# Patient Record
Sex: Male | Born: 1965 | Race: White | Hispanic: No | Marital: Married | Smoking: Never smoker
Health system: Southern US, Community
[De-identification: ages and names within clinical notes are randomized; demographics above are authoritative.]

## PROBLEM LIST (undated history)

## (undated) DIAGNOSIS — K219 Gastro-esophageal reflux disease without esophagitis: Secondary | ICD-10-CM

## (undated) DIAGNOSIS — I10 Essential (primary) hypertension: Secondary | ICD-10-CM

## (undated) HISTORY — PX: CYSTOSCOPY KIDNEY W/ URETERAL GUIDE WIRE: SUR371

## (undated) HISTORY — PX: LITHOTRIPSY: SUR834

## (undated) HISTORY — PX: KNEE ARTHROSCOPY W/ MENISCAL REPAIR: SHX1877

## (undated) HISTORY — DX: Gastro-esophageal reflux disease without esophagitis: K21.9

## (undated) HISTORY — DX: Essential (primary) hypertension: I10

## (undated) HISTORY — PX: WISDOM TOOTH EXTRACTION: SHX21

---

## 2000-07-19 ENCOUNTER — Ambulatory Visit (HOSPITAL_BASED_OUTPATIENT_CLINIC_OR_DEPARTMENT_OTHER): Admission: RE | Admit: 2000-07-19 | Discharge: 2000-07-19 | Payer: Self-pay | Admitting: Orthopedic Surgery

## 2007-12-01 ENCOUNTER — Encounter: Admission: RE | Admit: 2007-12-01 | Discharge: 2007-12-01 | Payer: Self-pay | Admitting: Orthopedic Surgery

## 2010-12-17 NOTE — Op Note (Signed)
[place]  Patient:    Robert Perkins, Robert Perkins                       MRN: [id number] Proc. Date: 07/19/00 Adm. Date:  [id number] Attending:  Georgena Spurling                           Operative Report  PREOPERATIVE DIAGNOSIS:  Right knee medial meniscus tear and possible plica.  POSTOPERATIVE DIAGNOSIS:  Right knee medial meniscus tear and possible plica.  OPERATION PERFORMED:  Right knee arthroscopy with partial medial meniscectomy and plica debridement.  SURGEON:  Georgena Spurling, M.D.  ASSISTANT:  None.  ANESTHESIA:  MAC.  INDICATIONS FOR PROCEDURE:  The patient is a 45 year old white male with history and physical exam findings as well as MRI findings of medial meniscus tear and possible synovitic symptomatic medial plica.  After informed consent was obtained, he was taken to the operating room.  DESCRIPTION OF PROCEDURE:  The patient was prepped and draped in the usual sterile fashion.  The inferolateral and inferomedial portals were created with a #11 blade, blunt trocar and cannula.  The camera was inserted in the inferolateral portal and a diagnostic arthroscopy was performed.  There was a large synovitic hypertrophy, medial patellofemoral plica.  There was a very large, very complex posterior horn medial meniscus tear.  The lateral compartment was normal.  There was very little chondromalacia in the patellofemoral lateral compartment.  There was grade 2 in the medial compartment.  With the straight basket forceps in the inferomedial compartment, a partial medial meniscectomy was performed.  The great white shaver was used to remove the debris.  This was a very complex tear and did require aggressive meniscectomy.  The great white shaver was then used to debride the synovitic hypertrophy, medial patellofemoral plica.  We then lavaged the knee joint.  We did one further diagnostic arthroscopy to ensure there were no loose bodies left behind and  then evacuated the joint of fluid and instrumentation.  We infiltrated both portals with 10 cc of 0.5% Marcaine morphine mixture and then closed them with Steri-Strips.  We then dressed the wound with 4 x 4s, ABDs, sterile Webril and Ace wrap.  TOURNIQUET TIME:  None.  ESTIMATED BLOOD LOSS:  Minimal.  COMPLICATIONS:  None.  DRAINS:  None. DD:  07/19/00 TD:  07/20/00 Job: [id number]

## 2013-08-15 ENCOUNTER — Encounter (INDEPENDENT_AMBULATORY_CARE_PROVIDER_SITE_OTHER): Payer: Self-pay | Admitting: Surgery

## 2013-08-23 ENCOUNTER — Ambulatory Visit (INDEPENDENT_AMBULATORY_CARE_PROVIDER_SITE_OTHER): Payer: Managed Care, Other (non HMO) | Admitting: Surgery

## 2013-08-23 ENCOUNTER — Encounter (INDEPENDENT_AMBULATORY_CARE_PROVIDER_SITE_OTHER): Payer: Self-pay | Admitting: Surgery

## 2013-08-23 VITALS — BP 112/82 | HR 80 | Temp 98.0°F | Resp 18 | Ht 71.0 in | Wt 224.0 lb

## 2013-08-23 DIAGNOSIS — K429 Umbilical hernia without obstruction or gangrene: Secondary | ICD-10-CM | POA: Insufficient documentation

## 2013-08-23 NOTE — Progress Notes (Signed)
Patient ID: Robert GemmaRandy D Perkins, male   DOB: 03/02/1966, 48 y.o.   MRN: [id number]  Chief Complaint  Patient presents with  . New Evaluation    UMB. Hernia    HPI Robert Perkins is a 48 y.o. male.  Referred by Dr. Tally Joeavid Swayne for evaluation of umbilical hernia HPI This is a healthy 48 year old male who presents with a one-month history of a palpable bulge at the upper edge of his umbilicus. This causes some mild discomfort. The patient's job requires a lot of heavy lifting.  He denies any obstructive symptoms. He was examined and was felt to have an umbilical hernia. Now referred for surgical evaluation. Past Medical History  Diagnosis Date  . GERD (gastroesophageal reflux disease)   . Hypertension     Past Surgical History  Procedure Laterality Date  . Knee arthroscopy w/ meniscal repair  2005/2008    bilaterally    Family History  Problem Relation Age of Onset  . Heart disease Father   . Heart disease Mother   . COPD Mother   . Thyroid nodules Mother   . Emphysema Mother     Social History History  Substance Use Topics  . Smoking status: Never Smoker   . Smokeless tobacco: Not on file  . Alcohol Use: No    No Known Allergies  Current Outpatient Prescriptions  Medication Sig Dispense Refill  . lisinopril (PRINIVIL,ZESTRIL) 20 MG tablet Take 20 mg by mouth daily.      . ranitidine (ZANTAC) 300 MG tablet Take 300 mg by mouth at bedtime.       No current facility-administered medications for this visit.    Review of Systems Review of Systems  Constitutional: Negative for fever, chills and unexpected weight change.  HENT: Negative for congestion, hearing loss, sore throat, trouble swallowing and voice change.   Eyes: Negative for visual disturbance.  Respiratory: Negative for cough and wheezing.   Cardiovascular: Negative for chest pain, palpitations and leg swelling.  Gastrointestinal: Positive for abdominal distention. Negative for nausea, vomiting, abdominal pain,  diarrhea, constipation, blood in stool, anal bleeding and rectal pain.  Genitourinary: Negative for hematuria and difficulty urinating.  Musculoskeletal: Negative for arthralgias.  Skin: Negative for rash and wound.  Neurological: Negative for seizures, syncope, weakness and headaches.  Hematological: Negative for adenopathy. Does not bruise/bleed easily.  Psychiatric/Behavioral: Negative for confusion.    Blood pressure 112/82, pulse 80, temperature 98 F (36.7 C), resp. rate 18, height 5\' 11"  (1.803 m), weight 224 lb (101.606 kg).  Physical Exam Physical Exam WDWN in NAD HEENT:  EOMI, sclera anicteric Neck:  No masses, no thyromegaly Lungs:  CTA bilaterally; normal respiratory effort CV:  Regular rate and rhythm; no murmurs Abd:  +bowel sounds, soft, palpable mass at upper edge of umbilicus - not completely reducible Ext:  Well-perfused; no edema Skin:  Warm, dry; no sign of jaundice  Data Reviewed none  Assessment    Umbilical hernia - partially reducible     Plan    Umbilical hernia repair with mesh.  The surgical procedure has been discussed with the patient.  Potential risks, benefits, alternative treatments, and expected outcomes have been explained.  All of the patient's questions at this time have been answered.  The likelihood of reaching the patient's treatment goal is good.  The patient understand the proposed surgical procedure and wishes to proceed.         Jaxsen Bernhart K. 08/23/2013, 10:43 AM

## 2013-09-02 ENCOUNTER — Encounter (HOSPITAL_BASED_OUTPATIENT_CLINIC_OR_DEPARTMENT_OTHER): Payer: Self-pay | Admitting: *Deleted

## 2013-09-02 NOTE — Progress Notes (Signed)
Will come in for [id number] 

## 2013-09-04 ENCOUNTER — Other Ambulatory Visit: Payer: Self-pay

## 2013-09-04 ENCOUNTER — Encounter (HOSPITAL_BASED_OUTPATIENT_CLINIC_OR_DEPARTMENT_OTHER)
Admission: RE | Admit: 2013-09-04 | Discharge: 2013-09-04 | Disposition: A | Discharge status: Home / Self Care | Payer: Managed Care, Other (non HMO) | Source: Ambulatory Visit | Attending: Surgery | Admitting: Surgery

## 2013-09-04 LAB — BASIC METABOLIC PANEL
BUN: 14 mg/dL (ref 6–23)
CALCIUM: 9.3 mg/dL (ref 8.4–10.5)
CO2: 27 meq/L (ref 19–32)
CREATININE: 1.09 mg/dL (ref 0.50–1.35)
Chloride: 101 mEq/L (ref 96–112)
GFR calc Af Amer: >90 mL/min (ref >90)
GFR, EST NON AFRICAN AMERICAN: 78 mL/min — AB (ref >90)
GLUCOSE: 82 mg/dL (ref 70–99)
Potassium: 4.6 mEq/L (ref 3.7–5.3)
Sodium: 140 mEq/L (ref 137–147)

## 2013-09-05 ENCOUNTER — Encounter (INDEPENDENT_AMBULATORY_CARE_PROVIDER_SITE_OTHER): Payer: Self-pay

## 2013-09-06 ENCOUNTER — Ambulatory Visit (HOSPITAL_BASED_OUTPATIENT_CLINIC_OR_DEPARTMENT_OTHER)
Admission: RE | Admit: 2013-09-06 | Discharge: 2013-09-06 | Disposition: A | Discharge status: Home / Self Care | Payer: Managed Care, Other (non HMO) | Source: Ambulatory Visit | Attending: Surgery | Admitting: Surgery

## 2013-09-06 ENCOUNTER — Encounter (HOSPITAL_BASED_OUTPATIENT_CLINIC_OR_DEPARTMENT_OTHER): Payer: Managed Care, Other (non HMO) | Admitting: Certified Registered"

## 2013-09-06 ENCOUNTER — Ambulatory Visit (HOSPITAL_BASED_OUTPATIENT_CLINIC_OR_DEPARTMENT_OTHER): Payer: Managed Care, Other (non HMO) | Admitting: Certified Registered"

## 2013-09-06 ENCOUNTER — Encounter (HOSPITAL_BASED_OUTPATIENT_CLINIC_OR_DEPARTMENT_OTHER)
Admission: RE | Disposition: A | Discharge status: Home / Self Care | Payer: Self-pay | Source: Ambulatory Visit | Attending: Surgery

## 2013-09-06 ENCOUNTER — Encounter (HOSPITAL_BASED_OUTPATIENT_CLINIC_OR_DEPARTMENT_OTHER): Payer: Self-pay | Admitting: *Deleted

## 2013-09-06 DIAGNOSIS — K219 Gastro-esophageal reflux disease without esophagitis: Secondary | ICD-10-CM | POA: Insufficient documentation

## 2013-09-06 DIAGNOSIS — I10 Essential (primary) hypertension: Secondary | ICD-10-CM | POA: Insufficient documentation

## 2013-09-06 DIAGNOSIS — K429 Umbilical hernia without obstruction or gangrene: Secondary | ICD-10-CM

## 2013-09-06 HISTORY — PX: UMBILICAL HERNIA REPAIR: SHX196

## 2013-09-06 LAB — POCT HEMOGLOBIN-HEMACUE: HEMOGLOBIN: 17.4 g/dL — AB (ref 13.0–17.0)

## 2013-09-06 SURGERY — REPAIR, HERNIA, UMBILICAL, ADULT
Anesthesia: General | Site: Abdomen

## 2013-09-06 MED ORDER — HYDROCODONE-IBUPROFEN 5-200 MG PO TABS: 1.0000 | ORAL_TABLET | Freq: Three times a day (TID) | ORAL | Status: DC | PRN

## 2013-09-06 MED ORDER — DEXAMETHASONE SODIUM PHOSPHATE 4 MG/ML IJ SOLN
INTRAMUSCULAR | Status: DC | PRN
  Administered 2013-09-06: 10 mg via INTRAVENOUS

## 2013-09-06 MED ORDER — CEFAZOLIN SODIUM-DEXTROSE 2-3 GM-% IV SOLR
2.0000 g | INTRAVENOUS | Status: AC
  Administered 2013-09-06: 2 g via INTRAVENOUS

## 2013-09-06 MED ORDER — BUPIVACAINE-EPINEPHRINE 0.25% -1:200000 IJ SOLN
INTRAMUSCULAR | Status: DC | PRN
  Administered 2013-09-06: 10 mL

## 2013-09-06 MED ORDER — LIDOCAINE HCL (CARDIAC) 20 MG/ML IV SOLN
INTRAVENOUS | Status: DC | PRN
  Administered 2013-09-06: 40 mg via INTRAVENOUS

## 2013-09-06 MED ORDER — OXYCODONE-ACETAMINOPHEN 5-325 MG PO TABS: 1.0000 | ORAL_TABLET | ORAL | Status: DC | PRN

## 2013-09-06 MED ORDER — PROPOFOL 10 MG/ML IV BOLUS
INTRAVENOUS | Status: DC | PRN
  Administered 2013-09-06: 200 mg via INTRAVENOUS

## 2013-09-06 MED ORDER — CHLORHEXIDINE GLUCONATE 4 % EX LIQD: 1.0000 "application " | Freq: Once | CUTANEOUS | Status: DC

## 2013-09-06 MED ORDER — OXYCODONE HCL 5 MG/5ML PO SOLN: 5.0000 mg | Freq: Once | ORAL | Status: AC | PRN

## 2013-09-06 MED ORDER — FENTANYL CITRATE 0.05 MG/ML IJ SOLN
INTRAMUSCULAR | Status: AC
  Filled 2013-09-06: qty 6

## 2013-09-06 MED ORDER — OXYCODONE HCL 5 MG PO TABS
5.0000 mg | ORAL_TABLET | Freq: Once | ORAL | Status: AC | PRN
  Administered 2013-09-06: 5 mg via ORAL
  Filled 2013-09-06: qty 1

## 2013-09-06 MED ORDER — HYDROMORPHONE HCL PF 1 MG/ML IJ SOLN: 0.2500 mg | INTRAMUSCULAR | Status: DC | PRN

## 2013-09-06 MED ORDER — PROMETHAZINE HCL 25 MG/ML IJ SOLN: 6.2500 mg | INTRAMUSCULAR | Status: DC | PRN

## 2013-09-06 MED ORDER — CEFAZOLIN SODIUM-DEXTROSE 2-3 GM-% IV SOLR
INTRAVENOUS | Status: AC
  Filled 2013-09-06: qty 50

## 2013-09-06 MED ORDER — MIDAZOLAM HCL 2 MG/2ML IJ SOLN
INTRAMUSCULAR | Status: AC
  Filled 2013-09-06: qty 2

## 2013-09-06 MED ORDER — LACTATED RINGERS IV SOLN
INTRAVENOUS | Status: DC
Start: 2013-09-06 — End: 2013-09-06
  Administered 2013-09-06: 14:00:00 via INTRAVENOUS

## 2013-09-06 MED ORDER — FENTANYL CITRATE 0.05 MG/ML IJ SOLN
INTRAMUSCULAR | Status: DC | PRN
  Administered 2013-09-06: 25 ug via INTRAVENOUS
  Administered 2013-09-06: 100 ug via INTRAVENOUS

## 2013-09-06 MED ORDER — BUPIVACAINE-EPINEPHRINE PF 0.25-1:200000 % IJ SOLN
INTRAMUSCULAR | Status: AC
  Filled 2013-09-06: qty 30

## 2013-09-06 MED ORDER — MORPHINE SULFATE 2 MG/ML IJ SOLN: 2.0000 mg | INTRAMUSCULAR | Status: DC | PRN

## 2013-09-06 MED ORDER — FENTANYL CITRATE 0.05 MG/ML IJ SOLN: 50.0000 ug | INTRAMUSCULAR | Status: DC | PRN

## 2013-09-06 MED ORDER — BUPIVACAINE-EPINEPHRINE PF 0.25-1:200000 % IJ SOLN
INTRAMUSCULAR | Status: AC
Start: 2013-09-06 — End: 2013-09-06
  Filled 2013-09-06: qty 30

## 2013-09-06 MED ORDER — MIDAZOLAM HCL 2 MG/2ML IJ SOLN: 1.0000 mg | INTRAMUSCULAR | Status: DC | PRN

## 2013-09-06 MED ORDER — SUCCINYLCHOLINE CHLORIDE 20 MG/ML IJ SOLN
INTRAMUSCULAR | Status: DC | PRN
  Administered 2013-09-06: 100 mg via INTRAVENOUS

## 2013-09-06 MED ORDER — KETOROLAC TROMETHAMINE 30 MG/ML IJ SOLN
INTRAMUSCULAR | Status: DC | PRN
  Administered 2013-09-06: 30 mg via INTRAVENOUS

## 2013-09-06 MED ORDER — ONDANSETRON HCL 4 MG/2ML IJ SOLN
INTRAMUSCULAR | Status: DC | PRN
  Administered 2013-09-06: 4 mg via INTRAVENOUS

## 2013-09-06 MED ORDER — MIDAZOLAM HCL 5 MG/5ML IJ SOLN
INTRAMUSCULAR | Status: DC | PRN
  Administered 2013-09-06: 2 mg via INTRAVENOUS

## 2013-09-06 MED ORDER — ONDANSETRON HCL 4 MG/2ML IJ SOLN: 4.0000 mg | INTRAMUSCULAR | Status: DC | PRN

## 2013-09-06 SURGICAL SUPPLY — 59 items
APL SKNCLS STERI-STRIP NONHPOA (GAUZE/BANDAGES/DRESSINGS) ×2
APPLICATOR COTTON TIP 6IN STRL (MISCELLANEOUS) IMPLANT
BENZOIN TINCTURE PRP APPL 2/3 (GAUZE/BANDAGES/DRESSINGS) ×3 IMPLANT
BLADE HEX COATED 2.75 (ELECTRODE) ×4 IMPLANT
BLADE SURG 15 STRL LF DISP TIS (BLADE) ×2 IMPLANT
BLADE SURG 15 STRL SS (BLADE) ×4
BLADE SURG ROTATE 9660 (MISCELLANEOUS) ×4 IMPLANT
CANISTER SUCT 1200ML W/VALVE (MISCELLANEOUS) IMPLANT
CHLORAPREP W/TINT 26ML (MISCELLANEOUS) ×4 IMPLANT
CLOSURE WOUND 1/2 X4 (GAUZE/BANDAGES/DRESSINGS)
COVER MAYO STAND STRL (DRAPES) ×4 IMPLANT
COVER TABLE BACK 60X90 (DRAPES) ×4 IMPLANT
DECANTER SPIKE VIAL GLASS SM (MISCELLANEOUS) ×1 IMPLANT
DRAPE LAPAROTOMY T 102X78X121 (DRAPES) ×4 IMPLANT
DRAPE UTILITY XL STRL (DRAPES) ×4 IMPLANT
DRSG TEGADERM 4X4.75 (GAUZE/BANDAGES/DRESSINGS) ×4 IMPLANT
ELECT REM PT RETURN 9FT ADLT (ELECTROSURGICAL) ×4
ELECTRODE REM PT RTRN 9FT ADLT (ELECTROSURGICAL) ×2 IMPLANT
GLOVE BIO SURGEON STRL SZ7 (GLOVE) ×4 IMPLANT
GLOVE BIOGEL PI IND STRL 7.0 (GLOVE) ×1 IMPLANT
GLOVE BIOGEL PI IND STRL 7.5 (GLOVE) ×2 IMPLANT
GLOVE BIOGEL PI INDICATOR 7.0 (GLOVE) ×2
GLOVE BIOGEL PI INDICATOR 7.5 (GLOVE) ×2
GLOVE ECLIPSE 6.5 STRL STRAW (GLOVE) ×3 IMPLANT
GOWN STRL REUS W/ TWL LRG LVL3 (GOWN DISPOSABLE) ×4 IMPLANT
GOWN STRL REUS W/TWL LRG LVL3 (GOWN DISPOSABLE) ×8
NDL HYPO 25X1 1.5 SAFETY (NEEDLE) IMPLANT
NDL SAFETY ECLIPSE 18X1.5 (NEEDLE) IMPLANT
NEEDLE HYPO 18GX1.5 SHARP (NEEDLE)
NEEDLE HYPO 25X1 1.5 SAFETY (NEEDLE) IMPLANT
NS IRRIG 1000ML POUR BTL (IV SOLUTION) IMPLANT
PACK BASIN DAY SURGERY FS (CUSTOM PROCEDURE TRAY) ×4 IMPLANT
PENCIL BUTTON HOLSTER BLD 10FT (ELECTRODE) ×4 IMPLANT
SLEEVE SCD COMPRESS KNEE MED (MISCELLANEOUS) ×3 IMPLANT
SPONGE GAUZE 4X4 12PLY STER LF (GAUZE/BANDAGES/DRESSINGS) ×5 IMPLANT
STAPLER VISISTAT 35W (STAPLE) IMPLANT
STRIP CLOSURE SKIN 1/2X4 (GAUZE/BANDAGES/DRESSINGS) IMPLANT
SUT MNCRL AB 4-0 PS2 18 (SUTURE) ×4 IMPLANT
SUT NOVA NAB DX-16 0-1 5-0 T12 (SUTURE) ×1 IMPLANT
SUT NOVA NAB GS-21 0 18 T12 DT (SUTURE) ×3 IMPLANT
SUT PDS AB 0 CT 36 (SUTURE) IMPLANT
SUT PROLENE 0 CT 1 30 (SUTURE) IMPLANT
SUT PROLENE 0 CT 1 CR/8 (SUTURE) IMPLANT
SUT SILK 3 0 TIES 17X18 (SUTURE)
SUT SILK 3-0 18XBRD TIE BLK (SUTURE) IMPLANT
SUT VIC AB 2-0 CT1 27 (SUTURE)
SUT VIC AB 2-0 CT1 TAPERPNT 27 (SUTURE) IMPLANT
SUT VIC AB 3-0 SH 27 (SUTURE) ×8
SUT VIC AB 3-0 SH 27X BRD (SUTURE) ×3 IMPLANT
SUT VIC AB 4-0 BRD 54 (SUTURE) IMPLANT
SUT VIC AB 4-0 SH 18 (SUTURE) IMPLANT
SUT VICRYL 4-0 PS2 18IN ABS (SUTURE) IMPLANT
SYR BULB 3OZ (MISCELLANEOUS) IMPLANT
SYR CONTROL 10ML LL (SYRINGE) IMPLANT
TOWEL OR 17X24 6PK STRL BLUE (TOWEL DISPOSABLE) ×5 IMPLANT
TOWEL OR NON WOVEN STRL DISP B (DISPOSABLE) ×4 IMPLANT
TUBE CONNECTING 20'X1/4 (TUBING)
TUBE CONNECTING 20X1/4 (TUBING) IMPLANT
YANKAUER SUCT BULB TIP NO VENT (SUCTIONS) IMPLANT

## 2013-09-06 NOTE — Discharge Instructions (Signed)
[facility] ° °UMBILICAL OR INGUINAL HERNIA REPAIR: POST OP INSTRUCTIONS ° °Always review your discharge instruction sheet given to you by the facility where your surgery was performed. °IF YOU HAVE DISABILITY OR FAMILY LEAVE FORMS, YOU MUST BRING THEM TO THE OFFICE FOR PROCESSING.   °DO NOT GIVE THEM TO YOUR DOCTOR. ° °1. A  prescription for pain medication may be given to you upon discharge.  Take your pain medication as prescribed, if needed.  If narcotic pain medicine is not needed, then you may take acetaminophen (Tylenol) or ibuprofen (Advil) as needed. °2. Take your usually prescribed medications unless otherwise directed. °3. If you need a refill on your pain medication, please contact your pharmacy.  They will contact our office to request authorization. Prescriptions will not be filled after 5 pm or on week-ends. °4. You should follow a light diet the first 24 hours after arrival home, such as soup and crackers, etc.  Be sure to include lots of fluids daily.  Resume your normal diet the day after surgery. °5. Most patients will experience some swelling and bruising around the umbilicus or in the groin and scrotum.  Ice packs and reclining will help.  Swelling and bruising can take several days to resolve.  °6. It is common to experience some constipation if taking pain medication after surgery.  Increasing fluid intake and taking a stool softener (such as Colace) will usually help or prevent this problem from occurring.  A mild laxative (Milk of Magnesia or Miralax) should be taken according to package directions if there are no bowel movements after 48 hours. °7. Unless discharge instructions indicate otherwise, you may remove your bandages 24-48 hours after surgery, and you may shower at that time.  You will have steri-strips (small skin tapes) in place directly over the incision.  These strips should be left on the skin for 7-10 days. °8. ACTIVITIES:  You may resume regular (light)  daily activities beginning the next day--such as daily self-care, walking, climbing stairs--gradually increasing activities as tolerated.  You may have sexual intercourse when it is comfortable.  Refrain from any heavy lifting or straining until approved by your doctor. °a. You may drive when you are no longer taking prescription pain medication, you can comfortably wear a seatbelt, and you can safely maneuver your car and apply brakes. °b. RETURN TO WORK:  2-3 weeks with light duty - no lifting over 15 lbs. °9. You should see your doctor in the office for a follow-up appointment approximately 2-3 weeks after your surgery.  Make sure that you call for this appointment within a day or two after you arrive home to insure a convenient appointment time. °10. OTHER INSTRUCTIONS:  __________________________________________________________________________________________________________________________________________________________________________________________  °WHEN TO CALL YOUR DOCTOR: °1. Fever over 101.0 °2. Inability to urinate °3. Nausea and/or vomiting °4. Extreme swelling or bruising °5. Continued bleeding from incision. °6. Increased pain, redness, or drainage from the incision ° °The clinic staff is available to answer your questions during regular business hours.  Please don’t hesitate to call and ask to speak to one of the nurses for clinical concerns.  If you have a medical emergency, go to the nearest emergency room or call 911.  A surgeon from [facility] is always on call at the hospital ° ° °[address], [address], [place], [place]  [address] ? ° [address] [address], [place], [place]   [address] °[phone]    [phone]    FAX [phone] °Web   site: [email or web address] ° ° °Post Anesthesia Home Care Instructions ° °Activity: °Get plenty of rest for the remainder of the day. A responsible adult should stay with you for 24 hours following the procedure.  °For the next 24  hours, DO NOT: °-Drive a car °-Operate machinery °-Drink alcoholic beverages °-Take any medication unless instructed by your physician °-Make any legal decisions or sign important papers. ° °Meals: °Start with liquid foods such as gelatin or soup. Progress to regular foods as tolerated. Avoid greasy, spicy, heavy foods. If nausea and/or vomiting occur, drink only clear liquids until the nausea and/or vomiting subsides. Call your physician if vomiting continues. ° °Special Instructions/Symptoms: °Your throat may feel dry or sore from the anesthesia or the breathing tube placed in your throat during surgery. If this causes discomfort, gargle with warm salt water. The discomfort should disappear within 24 hours. ° °

## 2013-09-06 NOTE — Interval H&P Note (Signed)
History and Physical Interval Note:  09/06/2013 11:43 AM  Robert Perkins  has presented today for surgery, with the diagnosis of UMBILICAL HERNIA  The various methods of treatment have been discussed with the patient and family. After consideration of risks, benefits and other options for treatment, the patient has consented to  Procedure(s): HERNIA REPAIR UMBILICAL ADULT (N/A) INSERTION OF MESH (N/A) as a surgical intervention .  The patient's history has been reviewed, patient examined, no change in status, stable for surgery.  I have reviewed the patient's chart and labs.  Questions were answered to the patient's satisfaction.     Dessie Tatem K.

## 2013-09-06 NOTE — Anesthesia Postprocedure Evaluation (Signed)
Anesthesia Post Note  Patient: Robert Perkins  Procedure(s) Performed: Procedure(s) (LRB): HERNIA REPAIR UMBILICAL ADULT (N/A)  Anesthesia type: general  Patient location: PACU  Post pain: Pain level controlled  Post assessment: Patient's Cardiovascular Status Stable  Last Vitals:  Filed Vitals:   09/06/13 1515  BP: 118/83  Pulse: 73  Temp:   Resp: 14    Post vital signs: Reviewed and stable  Level of consciousness: sedated  Complications: No apparent anesthesia complications

## 2013-09-06 NOTE — Op Note (Signed)
Indications:  The patient presented with a history of a small, slowly enlarging umbilical hernia.  The patient was examined and we recommended umbilical hernia repair.  Pre-operative diagnosis:  Umbilical hernia  Post-operative diagnosis:  Same  Procedure:  Umbilical hernia repair  Surgeon: Wynona LunaSUEI,Dhruti Ghuman K.   Assistants: none  Anesthesia: General endotracheal anesthesia  ASA Class: 1   Procedure Details  The patient was seen again in the Holding Room. The risks, benefits, complications, treatment options, and expected outcomes were discussed with the patient. The possibilities of reaction to medication, pulmonary aspiration, perforation of viscus, bleeding, recurrent infection, the need for additional procedures, and development of a complication requiring transfusion or further operation were discussed with the patient and/or family. There was concurrence with the proposed plan, and informed consent was obtained. The site of surgery was properly noted/marked. The patient was taken to the Operating Room, identified as Robert Perkins, and the procedure verified as umbilical hernia repair. A Time Out was held and the above information confirmed.  After an adequate level of general anesthesia was obtained, the patient's abdomen was prepped with Chloraprep and draped in sterile fashion.  We made a transverse incision above the umbilicus.  Dissection was carried down to the hernia sac with cautery.  We dissected bluntly around the hernia sac down to the edge of the fascial defect.  We reduced the hernia sac back into the pre-peritoneal space.  The fascial defect measured 7 mm.  We cleared the fascia in all directions.  The defect was too small to use mesh. The fascial defect was closed with multiple interrupted figure-of-eight 0 Novofil sutures.  The base of the umbilicus was tacked down with 3-0 Vicryl.  3-0 Vicryl was used to close the subcutaneous tissues and 4-0 Monocryl was used to close the  skin.  Steri-strips and clean dressing were applied.  The patient was extubated and brought to the recovery room in stable condition.  All sponge, instrument, and needle counts were correct prior to closure and at the conclusion of the case.   Estimated Blood Loss: Minimal          Complications: None; patient tolerated the procedure well.         Disposition: PACU - hemodynamically stable.         Condition: stable  Wilmon ArmsMatthew K. Corliss Skainssuei, MD, [facility]  General/ Trauma Surgery  09/06/2013 2:45 PM

## 2013-09-06 NOTE — Transfer of Care (Signed)
Immediate Anesthesia Transfer of Care Note  Patient: Robert Perkins  Procedure(s) Performed: Procedure(s): HERNIA REPAIR UMBILICAL ADULT (N/A)  Patient Location: PACU  Anesthesia Type:General  Level of Consciousness: awake and sedated  Airway & Oxygen Therapy: Patient Spontanous Breathing and Patient connected to face mask oxygen  Post-op Assessment: Report given to PACU RN and Post -op Vital signs reviewed and stable  Post vital signs: Reviewed and stable  Complications: No apparent anesthesia complications

## 2013-09-06 NOTE — Anesthesia Procedure Notes (Signed)
Procedure Name: Intubation Performed by: Sarahjane Matherly W Pre-anesthesia Checklist: Patient identified, Timeout performed, Emergency Drugs available, Suction available and Patient being monitored Patient Re-evaluated:Patient Re-evaluated prior to inductionOxygen Delivery Method: Circle system utilized Preoxygenation: Pre-oxygenation with 100% oxygen Intubation Type: IV induction Ventilation: Mask ventilation without difficulty Laryngoscope Size: Miller and 2 Grade View: Grade I Tube type: Oral Number of attempts: 1 Airway Equipment and Method: Stylet Placement Confirmation: ETT inserted through vocal cords under direct vision,  positive ETCO2 and breath sounds checked- equal and bilateral Secured at: 22 cm Tube secured with: Tape Dental Injury: Teeth and Oropharynx as per pre-operative assessment      

## 2013-09-06 NOTE — Anesthesia Preprocedure Evaluation (Signed)
Anesthesia Evaluation  Patient identified by MRN, date of birth, ID band Patient awake    Reviewed: Allergy & Precautions, H&P , NPO status , Patient's Chart, lab work & pertinent test results  Airway Mallampati: II TM Distance: >3 FB Neck ROM: Full    Dental  (+) Teeth Intact and Dental Advisory Given   Pulmonary neg pulmonary ROS,    Pulmonary exam normal       Cardiovascular hypertension, Pt. on medications     Neuro/Psych negative neurological ROS  negative psych ROS   GI/Hepatic Neg liver ROS, GERD-  Medicated,  Endo/Other  negative endocrine ROS  Renal/GU negative Renal ROS  negative genitourinary   Musculoskeletal negative musculoskeletal ROS (+)   Abdominal   Peds negative pediatric ROS (+)  Hematology negative hematology ROS (+)   Anesthesia Other Findings   Reproductive/Obstetrics negative OB ROS                           Anesthesia Physical Anesthesia Plan  ASA: II  Anesthesia Plan: General   Post-op Pain Management:    Induction: Intravenous  Airway Management Planned: LMA and Oral ETT  Additional Equipment:   Intra-op Plan:   Post-operative Plan: Extubation in OR  Informed Consent: I have reviewed the patients History and Physical, chart, labs and discussed the procedure including the risks, benefits and alternatives for the proposed anesthesia with the patient or authorized representative who has indicated his/her understanding and acceptance.   Dental advisory given  Plan Discussed with: CRNA, Anesthesiologist and Surgeon  Anesthesia Plan Comments:         Anesthesia Quick Evaluation

## 2013-09-06 NOTE — H&P (View-Only) (Signed)
Patient ID: Robert Perkins Zent, male   DOB: 1966/01/23, 48 y.o.   MRN: [id number]  Chief Complaint  Patient presents with  . New Evaluation    UMB. Hernia    HPI Robert Perkins Plate is a 48 y.o. male.  Referred by Dr. Tally Joeavid Swayne for evaluation of umbilical hernia HPI This is a healthy 48 year old male who presents with a one-month history of a palpable bulge at the upper edge of his umbilicus. This causes some mild discomfort. The patient's job requires a lot of heavy lifting.  He denies any obstructive symptoms. He was examined and was felt to have an umbilical hernia. Now referred for surgical evaluation. Past Medical History  Diagnosis Date  . GERD (gastroesophageal reflux disease)   . Hypertension     Past Surgical History  Procedure Laterality Date  . Knee arthroscopy w/ meniscal repair  2005/2008    bilaterally    Family History  Problem Relation Age of Onset  . Heart disease Father   . Heart disease Mother   . COPD Mother   . Thyroid nodules Mother   . Emphysema Mother     Social History History  Substance Use Topics  . Smoking status: Never Smoker   . Smokeless tobacco: Not on file  . Alcohol Use: No    No Known Allergies  Current Outpatient Prescriptions  Medication Sig Dispense Refill  . lisinopril (PRINIVIL,ZESTRIL) 20 MG tablet Take 20 mg by mouth daily.      . ranitidine (ZANTAC) 300 MG tablet Take 300 mg by mouth at bedtime.       No current facility-administered medications for this visit.    Review of Systems Review of Systems  Constitutional: Negative for fever, chills and unexpected weight change.  HENT: Negative for congestion, hearing loss, sore throat, trouble swallowing and voice change.   Eyes: Negative for visual disturbance.  Respiratory: Negative for cough and wheezing.   Cardiovascular: Negative for chest pain, palpitations and leg swelling.  Gastrointestinal: Positive for abdominal distention. Negative for nausea, vomiting, abdominal pain,  diarrhea, constipation, blood in stool, anal bleeding and rectal pain.  Genitourinary: Negative for hematuria and difficulty urinating.  Musculoskeletal: Negative for arthralgias.  Skin: Negative for rash and wound.  Neurological: Negative for seizures, syncope, weakness and headaches.  Hematological: Negative for adenopathy. Does not bruise/bleed easily.  Psychiatric/Behavioral: Negative for confusion.    Blood pressure 112/82, pulse 80, temperature 98 F (36.7 C), resp. rate 18, height 5\' 11"  (1.803 m), weight 224 lb (101.606 kg).  Physical Exam Physical Exam WDWN in NAD HEENT:  EOMI, sclera anicteric Neck:  No masses, no thyromegaly Lungs:  CTA bilaterally; normal respiratory effort CV:  Regular rate and rhythm; no murmurs Abd:  +bowel sounds, soft, palpable mass at upper edge of umbilicus - not completely reducible Ext:  Well-perfused; no edema Skin:  Warm, dry; no sign of jaundice  Data Reviewed none  Assessment    Umbilical hernia - partially reducible     Plan    Umbilical hernia repair with mesh.  The surgical procedure has been discussed with the patient.  Potential risks, benefits, alternative treatments, and expected outcomes have been explained.  All of the patient's questions at this time have been answered.  The likelihood of reaching the patient's treatment goal is good.  The patient understand the proposed surgical procedure and wishes to proceed.         Jeyson Deshotel K. 08/23/2013, 10:43 AM

## 2013-09-09 ENCOUNTER — Encounter (HOSPITAL_BASED_OUTPATIENT_CLINIC_OR_DEPARTMENT_OTHER): Payer: Self-pay | Admitting: Surgery

## 2013-09-18 ENCOUNTER — Encounter (INDEPENDENT_AMBULATORY_CARE_PROVIDER_SITE_OTHER): Payer: Managed Care, Other (non HMO) | Admitting: Surgery

## 2013-09-20 ENCOUNTER — Ambulatory Visit (INDEPENDENT_AMBULATORY_CARE_PROVIDER_SITE_OTHER): Payer: Managed Care, Other (non HMO) | Admitting: Surgery

## 2013-09-20 ENCOUNTER — Encounter (INDEPENDENT_AMBULATORY_CARE_PROVIDER_SITE_OTHER): Payer: Self-pay | Admitting: Surgery

## 2013-09-20 DIAGNOSIS — K429 Umbilical hernia without obstruction or gangrene: Secondary | ICD-10-CM

## 2013-09-20 NOTE — Progress Notes (Signed)
Status post umbilical hernia repair on 09/06/13. The patient had a very small defect which was repaired primarily with suture. He is doing quite well. His incision is healed no sign of infection. No sign of recurrent hernia. Appetite and bowel movements are normal. His job requires a lot of heavy lifting so we will keep him out of work for one more week. He may then resume full activity. Followup when necessary.  Wilmon ArmsMatthew K. Corliss Skainssuei, MD, [facility]  General/ Trauma Surgery  09/20/2013 10:31 AM

## 2015-10-08 ENCOUNTER — Other Ambulatory Visit: Payer: Self-pay | Admitting: Family Medicine

## 2015-10-08 DIAGNOSIS — R911 Solitary pulmonary nodule: Secondary | ICD-10-CM

## 2016-01-06 ENCOUNTER — Ambulatory Visit
Admission: RE | Admit: 2016-01-06 | Discharge: 2016-01-06 | Disposition: A | Discharge status: Home / Self Care | Payer: BLUE CROSS/BLUE SHIELD | Source: Ambulatory Visit | Attending: Family Medicine | Admitting: Family Medicine

## 2016-01-06 DIAGNOSIS — R911 Solitary pulmonary nodule: Secondary | ICD-10-CM

## 2016-01-06 MED ORDER — IOPAMIDOL (ISOVUE-300) INJECTION 61%
75.0000 mL | Freq: Once | INTRAVENOUS | Status: AC | PRN
  Administered 2016-01-06: 75 mL via INTRAVENOUS

## 2017-03-28 ENCOUNTER — Ambulatory Visit: Payer: Self-pay | Admitting: Podiatry

## 2017-08-03 ENCOUNTER — Other Ambulatory Visit: Payer: Self-pay | Admitting: Gastroenterology

## 2017-08-03 DIAGNOSIS — K2100 Gastro-esophageal reflux disease with esophagitis, without bleeding: Secondary | ICD-10-CM

## 2017-08-03 DIAGNOSIS — R1310 Dysphagia, unspecified: Secondary | ICD-10-CM

## 2017-08-07 ENCOUNTER — Other Ambulatory Visit: Payer: BLUE CROSS/BLUE SHIELD

## 2017-08-09 ENCOUNTER — Ambulatory Visit
Admission: RE | Admit: 2017-08-09 | Discharge: 2017-08-09 | Disposition: A | Discharge status: Home / Self Care | Payer: No Typology Code available for payment source | Source: Ambulatory Visit | Attending: Gastroenterology | Admitting: Gastroenterology

## 2017-08-09 DIAGNOSIS — R1310 Dysphagia, unspecified: Secondary | ICD-10-CM

## 2017-08-09 DIAGNOSIS — K2100 Gastro-esophageal reflux disease with esophagitis, without bleeding: Secondary | ICD-10-CM

## 2018-01-08 ENCOUNTER — Other Ambulatory Visit: Payer: Self-pay | Admitting: Orthopedic Surgery

## 2018-01-08 DIAGNOSIS — G8929 Other chronic pain: Secondary | ICD-10-CM

## 2018-01-08 DIAGNOSIS — M25512 Pain in left shoulder: Principal | ICD-10-CM

## 2018-01-12 ENCOUNTER — Ambulatory Visit
Admission: RE | Admit: 2018-01-12 | Discharge: 2018-01-12 | Disposition: A | Discharge status: Home / Self Care | Payer: No Typology Code available for payment source | Source: Ambulatory Visit | Attending: Orthopedic Surgery | Admitting: Orthopedic Surgery

## 2018-01-12 ENCOUNTER — Other Ambulatory Visit: Payer: Self-pay | Admitting: Orthopedic Surgery

## 2018-01-12 DIAGNOSIS — Z77018 Contact with and (suspected) exposure to other hazardous metals: Secondary | ICD-10-CM

## 2018-01-12 DIAGNOSIS — G8929 Other chronic pain: Secondary | ICD-10-CM

## 2018-01-12 DIAGNOSIS — M25512 Pain in left shoulder: Principal | ICD-10-CM

## 2018-01-17 ENCOUNTER — Other Ambulatory Visit: Payer: Self-pay | Admitting: Family Medicine

## 2018-01-17 DIAGNOSIS — R911 Solitary pulmonary nodule: Secondary | ICD-10-CM

## 2018-01-23 ENCOUNTER — Other Ambulatory Visit: Payer: No Typology Code available for payment source

## 2018-01-25 ENCOUNTER — Other Ambulatory Visit: Payer: No Typology Code available for payment source

## 2018-03-27 ENCOUNTER — Ambulatory Visit
Admission: RE | Admit: 2018-03-27 | Discharge: 2018-03-27 | Disposition: A | Discharge status: Home / Self Care | Payer: No Typology Code available for payment source | Source: Ambulatory Visit | Attending: Family Medicine | Admitting: Family Medicine

## 2018-03-27 DIAGNOSIS — R911 Solitary pulmonary nodule: Secondary | ICD-10-CM

## 2018-09-12 DIAGNOSIS — I10 Essential (primary) hypertension: Secondary | ICD-10-CM | POA: Diagnosis not present

## 2018-09-12 DIAGNOSIS — E7800 Pure hypercholesterolemia, unspecified: Secondary | ICD-10-CM | POA: Diagnosis not present

## 2018-09-12 DIAGNOSIS — K219 Gastro-esophageal reflux disease without esophagitis: Secondary | ICD-10-CM | POA: Diagnosis not present

## 2018-09-12 DIAGNOSIS — Z0000 Encounter for general adult medical examination without abnormal findings: Secondary | ICD-10-CM | POA: Diagnosis not present

## 2018-09-12 DIAGNOSIS — Z125 Encounter for screening for malignant neoplasm of prostate: Secondary | ICD-10-CM | POA: Diagnosis not present

## 2019-08-06 DIAGNOSIS — Z125 Encounter for screening for malignant neoplasm of prostate: Secondary | ICD-10-CM | POA: Diagnosis not present

## 2019-08-06 DIAGNOSIS — I10 Essential (primary) hypertension: Secondary | ICD-10-CM | POA: Diagnosis not present

## 2019-08-06 DIAGNOSIS — E7800 Pure hypercholesterolemia, unspecified: Secondary | ICD-10-CM | POA: Diagnosis not present

## 2019-08-06 DIAGNOSIS — K219 Gastro-esophageal reflux disease without esophagitis: Secondary | ICD-10-CM | POA: Diagnosis not present

## 2019-08-24 IMAGING — MR MR SHOULDER*L* W/O CM
4 of 5 series · 16 of 40 positions shown · non-contrast
Comparison: None.

CLINICAL DATA: Left shoulder pain and limited range of motion for 4
months. No known injury.

EXAM:
MRI OF THE LEFT SHOULDER WITHOUT CONTRAST
TECHNIQUE: Multiplanar, multisequence MR imaging of the shoulder was performed.
No intravenous contrast was administered.

[Series 7: T2 fat-sat · oblique · left · 3.0mm · 0.44mm/px · 3 of 21 slices shown (1 of 3)]
[im 4/21]
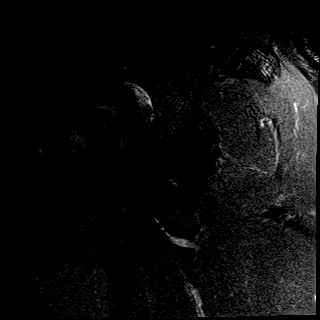
[im 11/21]
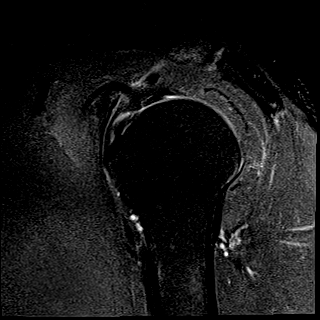
[im 17/21]
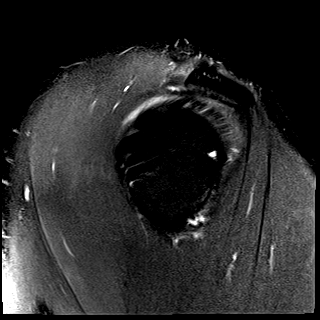

[Series 8: T2 fat-sat · axial · left · 3.0mm · 0.55mm/px · z∈[-66,+7]mm · 3 of 28 slices shown (2 of 3)]
[im 4/28]
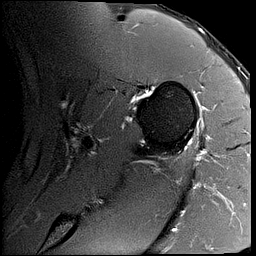
[im 16/28]
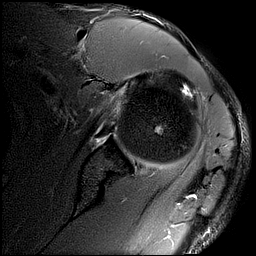
[im 25/28]
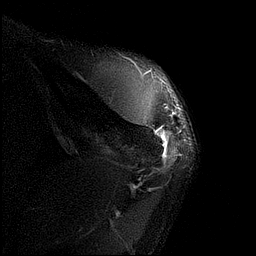

[Series 10: T2 fat-sat · oblique · left · 3.0mm · 0.44mm/px · 3 of 23 slices shown (3 of 3)]
[im 4/23]
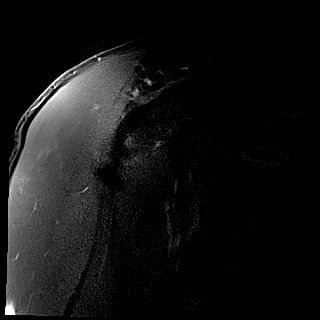
[im 13/23]
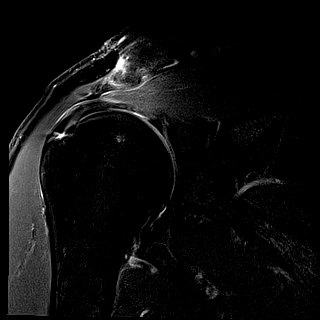
[im 19/23]
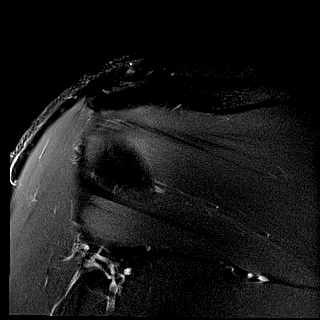

[Series 11: PD · oblique · left · 3.0mm · 0.18mm/px · 7 of 23 slices shown]
[im 1/23]
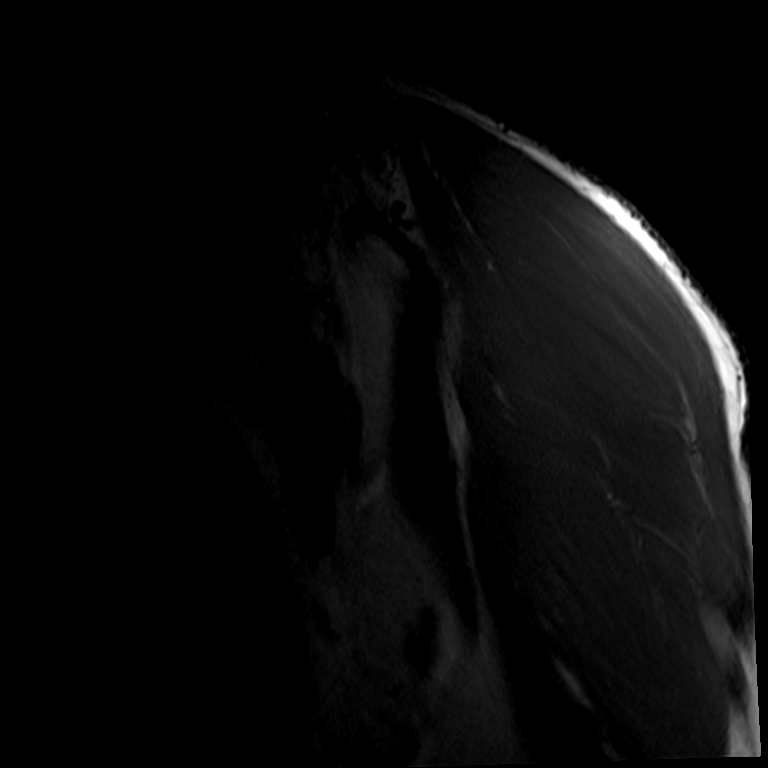
[im 4/23]
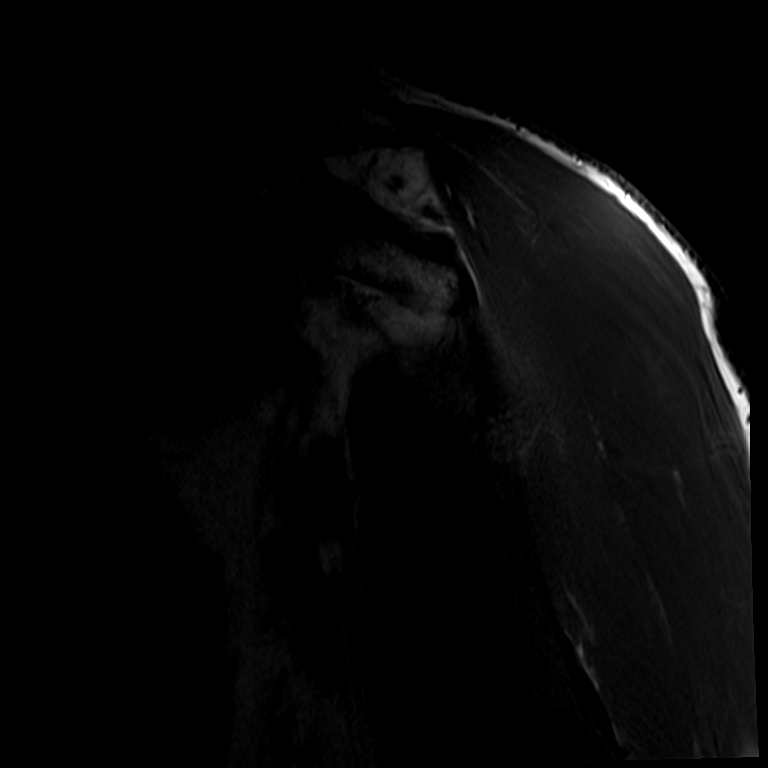
[im 7/23]
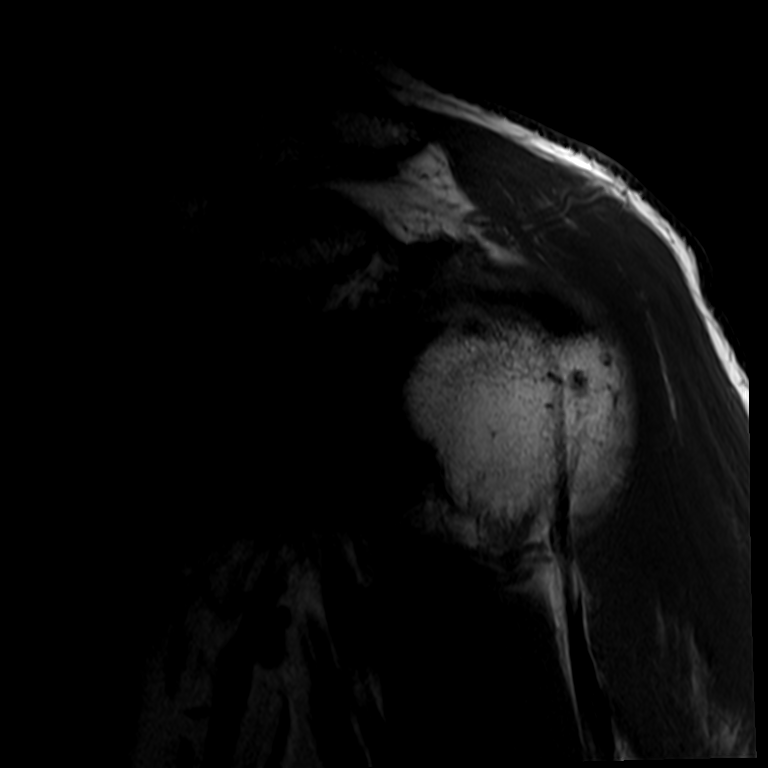
[im 10/23]
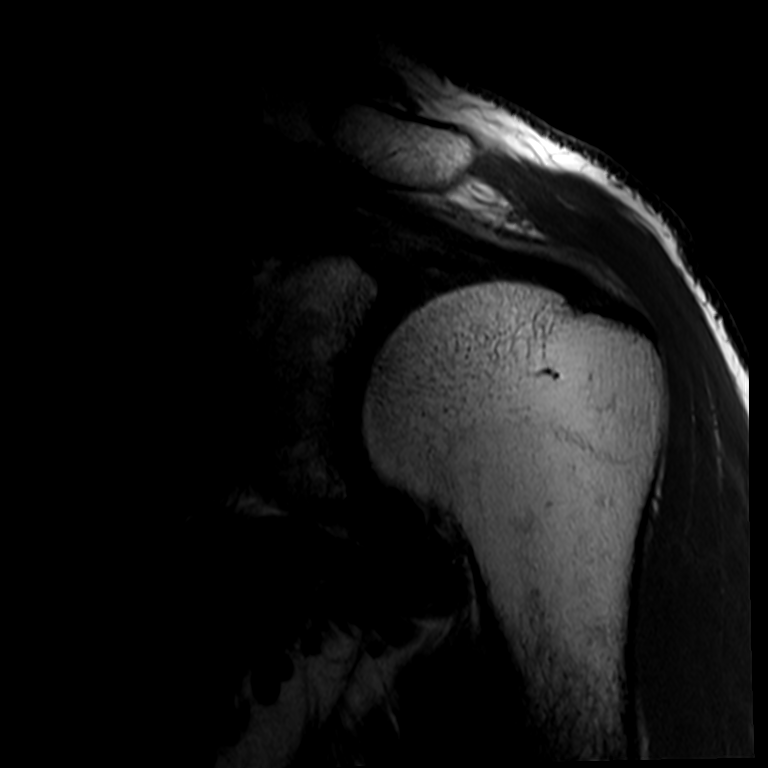
[im 13/23]
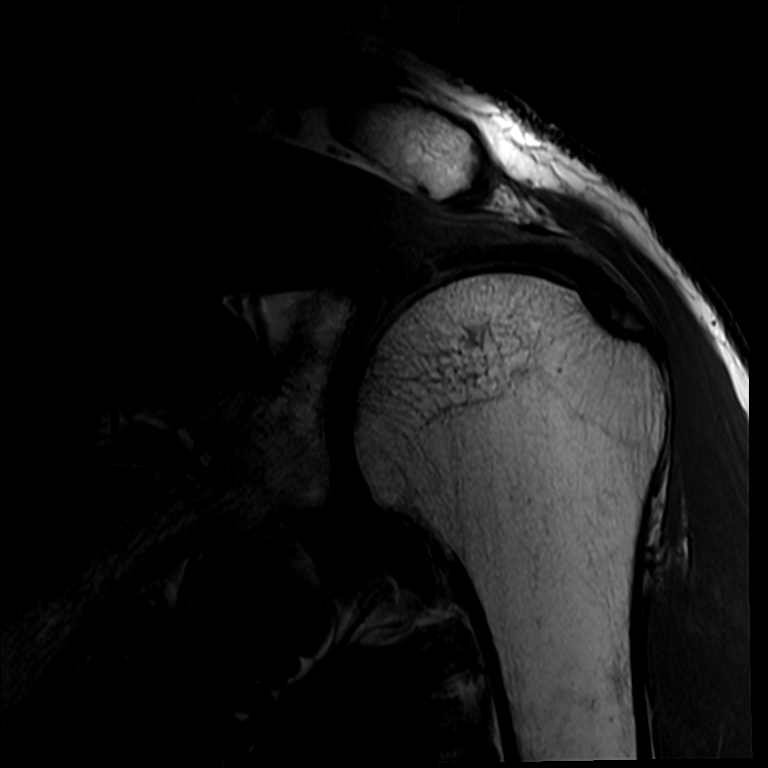
[im 16/23]
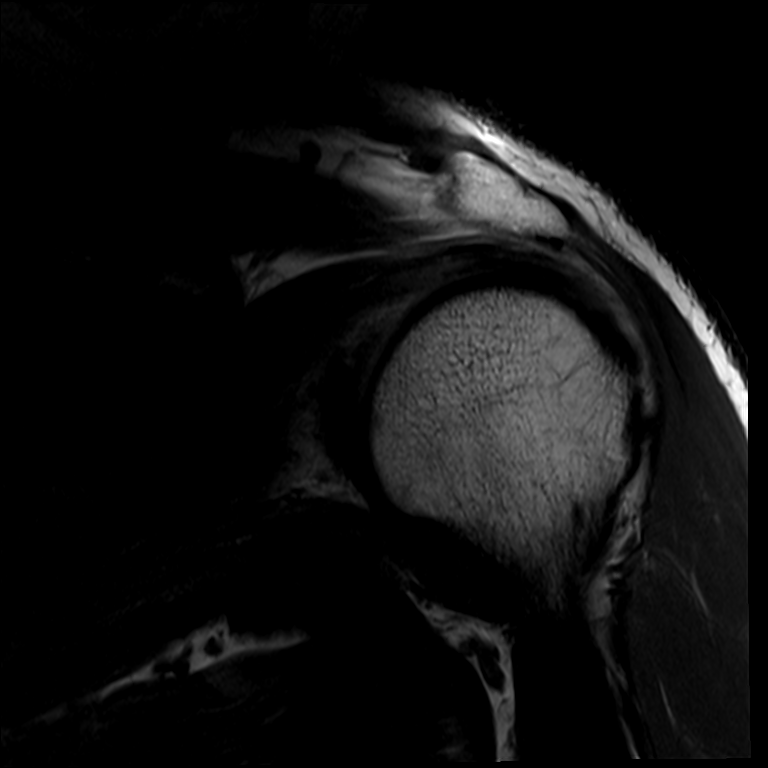
[im 19/23]
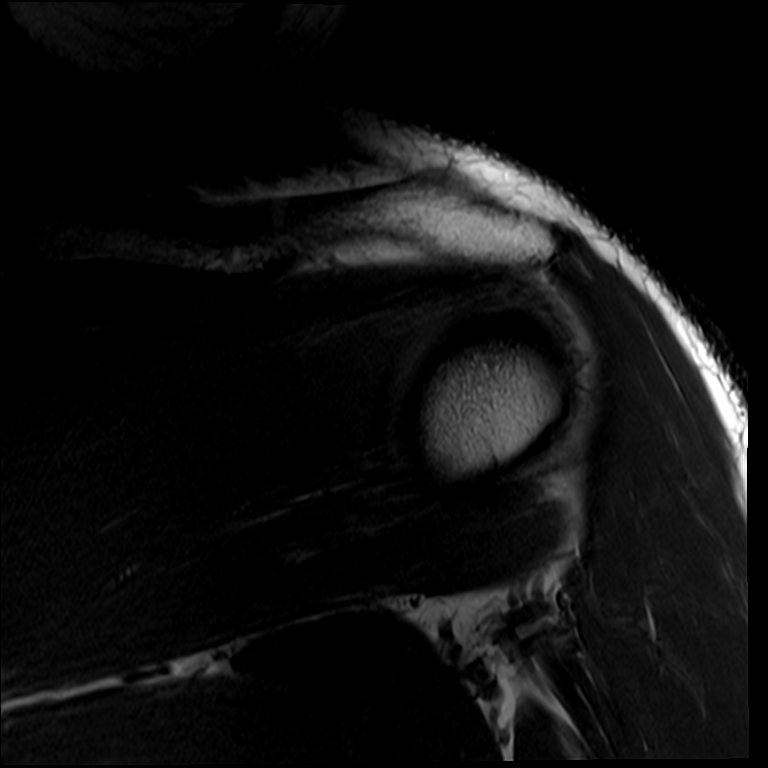

[16 of 40 positions shown; findings below may reference images not displayed]

FINDINGS: Rotator cuff: There is thickening and heterogeneously increased T2
signal in all the rotator cuff tendons consistent with tendinopathy.
Shallow undersurface tear of the far lateral supraspinatus measures
0.5 cm from front to back. The tear is in the posterior
infraspinatus. There is also a small interstitial tear of the
superior fibers of the subscapularis.

Muscles:  Normal without atrophy or focal lesion.

Biceps long head: Intact. Tendinopathy of the intra-articular
segment is identified.

Acromioclavicular Joint: Mild-to-moderate degenerative change is
present. Type 1 acromion. No subacromial/subdeltoid bursal fluid.

Glenohumeral Joint: Appears normal.

Labrum: The superior and posterior, superior labrum appear
degenerated without definite tear.

Bones:  No fracture or worrisome lesion.

Other: None.
IMPRESSION: Rotator cuff tendinopathy with a shallow undersurface tear of the
posterior fibers of the supraspinatus measuring 0.5 cm from front to
back. No retraction or atrophy. Small interstitial tear of the
superior fibers of the subscapularis also noted.

Tendinopathy of the intra-articular long head of biceps.

Mild to moderate acromioclavicular osteoarthritis.

Fraying of the superior and posterior, superior labrum without
definite tear identified.

## 2019-08-27 DIAGNOSIS — L300 Nummular dermatitis: Secondary | ICD-10-CM | POA: Diagnosis not present

## 2019-11-06 DIAGNOSIS — J019 Acute sinusitis, unspecified: Secondary | ICD-10-CM | POA: Diagnosis not present

## 2019-11-06 DIAGNOSIS — Z20828 Contact with and (suspected) exposure to other viral communicable diseases: Secondary | ICD-10-CM | POA: Diagnosis not present

## 2019-11-06 DIAGNOSIS — R509 Fever, unspecified: Secondary | ICD-10-CM | POA: Diagnosis not present

## 2019-11-06 DIAGNOSIS — B342 Coronavirus infection, unspecified: Secondary | ICD-10-CM | POA: Diagnosis not present

## 2020-02-11 DIAGNOSIS — E7800 Pure hypercholesterolemia, unspecified: Secondary | ICD-10-CM | POA: Diagnosis not present

## 2020-02-11 DIAGNOSIS — I10 Essential (primary) hypertension: Secondary | ICD-10-CM | POA: Diagnosis not present

## 2020-02-11 DIAGNOSIS — K219 Gastro-esophageal reflux disease without esophagitis: Secondary | ICD-10-CM | POA: Diagnosis not present

## 2020-08-18 DIAGNOSIS — K219 Gastro-esophageal reflux disease without esophagitis: Secondary | ICD-10-CM | POA: Diagnosis not present

## 2020-08-18 DIAGNOSIS — Z125 Encounter for screening for malignant neoplasm of prostate: Secondary | ICD-10-CM | POA: Diagnosis not present

## 2020-08-18 DIAGNOSIS — E7800 Pure hypercholesterolemia, unspecified: Secondary | ICD-10-CM | POA: Diagnosis not present

## 2020-08-18 DIAGNOSIS — I10 Essential (primary) hypertension: Secondary | ICD-10-CM | POA: Diagnosis not present

## 2020-10-19 DIAGNOSIS — Z0000 Encounter for general adult medical examination without abnormal findings: Secondary | ICD-10-CM | POA: Diagnosis not present

## 2021-11-02 ENCOUNTER — Encounter: Payer: Self-pay | Admitting: Internal Medicine

## 2021-11-02 ENCOUNTER — Ambulatory Visit: Payer: PRIVATE HEALTH INSURANCE | Admitting: Internal Medicine

## 2021-11-02 VITALS — BP 126/80 | HR 83 | Ht 71.0 in | Wt 230.6 lb

## 2021-11-02 DIAGNOSIS — I2510 Atherosclerotic heart disease of native coronary artery without angina pectoris: Secondary | ICD-10-CM | POA: Diagnosis not present

## 2021-11-02 MED ORDER — ROSUVASTATIN CALCIUM 20 MG PO TABS: 20.0000 mg | ORAL_TABLET | Freq: Every day | ORAL | 3 refills | Status: AC

## 2021-11-02 NOTE — Patient Instructions (Addendum)
Medication Instructions:  ?INCREASE CRESTOR TO 20mg  ONCE DAILY ?*If you need a refill on your cardiac medications before your next appointment, please call your pharmacy* ? ?Follow-Up: ?At [facility], you and your health needs are our priority.  As part of our continuing mission to provide you with exceptional heart care, we have created designated Provider Care Teams.  These Care Teams include your primary Cardiologist (physician) and Advanced Practice Providers (APPs -  Physician Assistants and Nurse Practitioners) who all work together to provide you with the care you need, when you need it. ? ?We recommend signing up for the patient portal called "MyChart".  Sign up information is provided on this After Visit Summary.  MyChart is used to connect with patients for Virtual Visits (Telemedicine).  Patients are able to view lab/test results, encounter notes, upcoming appointments, etc.  Non-urgent messages can be sent to your provider as well.   ?To learn more about what you can do with MyChart, go to [facility].   ? ?Your next appointment:   ?AS NEEDED  ? ?The format for your next appointment:   ?In Person ? ?Provider:   ?[email or web address], MD   ? ?Heart-Healthy Eating Plan ?Heart-healthy meal planning includes: ?Eating less unhealthy fats. ?Eating more healthy fats. ?Making other changes in your diet. ?Talk with your doctor or a diet specialist (dietitian) to create an eating plan that is right for you. ?What is my plan? ?Your doctor may recommend an eating plan that includes: ?Total fat: ______% or less of total calories a day. ?Saturated fat: ______% or less of total calories a day. ?Cholesterol: less than _________mg a day. ?What are tips for following this plan? ?Cooking ?Avoid frying your food. Try to bake, boil, grill, or broil it instead. You can also reduce fat by: ?Removing the skin from poultry. ?Removing all visible fats from meats. ?Steaming vegetables in water or broth. ?Meal  planning ? ?At meals, divide your plate into four equal parts: ?Fill one-half of your plate with vegetables and green salads. ?Fill one-fourth of your plate with whole grains. ?Fill one-fourth of your plate with lean protein foods. ?Eat 4-5 servings of vegetables per day. A serving of vegetables is: ?1 cup of raw or cooked vegetables. ?2 cups of raw leafy greens. ?Eat 4-5 servings of fruit per day. A serving of fruit is: ?1 medium whole fruit. ?? cup of dried fruit. ?? cup of fresh, frozen, or canned fruit. ?? cup of 100% fruit juice. ?Eat more foods that have soluble fiber. These are apples, broccoli, carrots, beans, peas, and barley. Try to get 20-30 g of fiber per day. ?Eat 4-5 servings of nuts, legumes, and seeds per week: ?1 serving of dried beans or legumes equals ? cup after being cooked. ?1 serving of nuts is ? cup. ?1 serving of seeds equals 1 tablespoon. ?General information ?Eat more home-cooked food. Eat less restaurant, buffet, and fast food. ?Limit or avoid alcohol. ?Limit foods that are high in starch and sugar. ?Avoid fried foods. ?Lose weight if you are overweight. ?Keep track of how much salt (sodium) you eat. This is important if you have high blood pressure. Ask your doctor to tell you more about this. ?Try to add vegetarian meals each week. ?Fats ?Choose healthy fats. These include olive oil and canola oil, flaxseeds, walnuts, almonds, and seeds. ?Eat more omega-3 fats. These include salmon, mackerel, sardines, tuna, flaxseed oil, and ground flaxseeds. Try to eat fish at least 2 times each week. ?Check food  labels. Avoid foods with trans fats or high amounts of saturated fat. ?Limit saturated fats. ?These are often found in animal products, such as meats, butter, and cream. ?These are also found in plant foods, such as palm oil, palm kernel oil, and coconut oil. ?Avoid foods with partially hydrogenated oils in them. These have trans fats. Examples are stick margarine, some tub margarines,  cookies, crackers, and other baked goods. ?What foods can I eat? ?Fruits ?All fresh, canned (in natural juice), or frozen fruits. ?Vegetables ?Fresh or frozen vegetables (raw, steamed, roasted, or grilled). Green salads. ?Grains ?Most grains. Choose whole wheat and whole grains most of the time. Rice and pasta, including brown rice and pastas made with whole wheat. ?Meats and other proteins ?Lean, well-trimmed beef, veal, pork, and lamb. Chicken and [place] without skin. All fish and shellfish. Wild duck, rabbit, pheasant, and venison. Egg whites or low-cholesterol egg substitutes. Dried beans, peas, lentils, and tofu. Seeds and most nuts. ?Dairy ?Low-fat or nonfat cheeses, including ricotta and mozzarella. Skim or 1% milk that is liquid, powdered, or evaporated. Buttermilk that is made with low-fat milk. Nonfat or low-fat yogurt. ?Fats and oils ?Non-hydrogenated (trans-free) margarines. Vegetable oils, including soybean, sesame, sunflower, olive, peanut, safflower, corn, canola, and cottonseed. Salad dressings or mayonnaise made with a vegetable oil. ?Beverages ?Mineral water. Coffee and tea. Diet carbonated beverages. ?Sweets and desserts ?Sherbet, gelatin, and fruit ice. Small amounts of dark chocolate. ?Limit all sweets and desserts. ?Seasonings and condiments ?All seasonings and condiments. ?The items listed above may not be a complete list of foods and drinks you can eat. Contact a dietitian for more options. ?What foods should I avoid? ?Fruits ?Canned fruit in heavy syrup. Fruit in cream or butter sauce. Fried fruit. Limit coconut. ?Vegetables ?Vegetables cooked in cheese, cream, or butter sauce. Fried vegetables. ?Grains ?Breads that are made with saturated or trans fats, oils, or whole milk. Croissants. Sweet rolls. Donuts. High-fat crackers, such as cheese crackers. ?Meats and other proteins ?Fatty meats, such as hot dogs, ribs, sausage, bacon, rib-eye roast or steak. High-fat deli meats, such as salami and  bologna. Caviar. Domestic duck and goose. Organ meats, such as liver. ?Dairy ?Cream, sour cream, cream cheese, and creamed cottage cheese. Whole-milk cheeses. Whole or 2% milk that is liquid, evaporated, or condensed. Whole buttermilk. Cream sauce or high-fat cheese sauce. Yogurt that is made from whole milk. ?Fats and oils ?Meat fat, or shortening. Cocoa butter, hydrogenated oils, palm oil, coconut oil, palm kernel oil. Solid fats and shortenings, including bacon fat, salt pork, lard, and butter. Nondairy cream substitutes. Salad dressings with cheese or sour cream. ?Beverages ?Regular sodas and juice drinks with added sugar. ?Sweets and desserts ?Frosting. Pudding. Cookies. Cakes. Pies. Milk chocolate or white chocolate. Buttered syrups. Full-fat ice cream or ice cream drinks. ?The items listed above may not be a complete list of foods and drinks to avoid. Contact a dietitian for more information. ?Summary ?Heart-healthy meal planning includes eating less unhealthy fats, eating more healthy fats, and making other changes in your diet. ?Eat a balanced diet. This includes fruits and vegetables, low-fat or nonfat dairy, lean protein, nuts and legumes, whole grains, and heart-healthy oils and fats. ?This information is not intended to replace advice given to you by your health care provider. Make sure you discuss any questions you have with your health care provider. ?Document Revised: 11/26/2020 Document Reviewed: 11/26/2020 ?Elsevier Patient Education ? 2022 [employer] Inc[employer] ? ?

## 2021-11-02 NOTE — Progress Notes (Signed)
?Cardiology Office Note:   ? ?Date:  11/02/2021  ? ?ID:  Robert Perkins, DOB [birth year], MRN [id number] ? ?PCP:  [name], MD ?  ?[facility] Prov[facility] ?Cardiologist:  Maisie Fus, MD    ? ?Referring MD: Aliene Beams, MD  ? ?No chief complaint on file. 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 ?History of Present Illness:   ? ?Robert Perkins is a 56 y.o. 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 ?Patient had CT scan for lung nodule follow up in 2019. 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 He is asymptomatic. He lifts weights 3x per week. He denies chest pressure and no SOB. He has no CHF symptoms. No report of palpitations/syncope. He takes crestor 10 mg and lisinopril. EKG shows ? low voltage/inferior q could be lead placement. No prior q waves. ? ?Family Hx: Father deceased, heart attack age 51. Mother deceased had htn and DM2. ? ?Social Hx: Never smoked ? ?Past Medical History:  ?Diagnosis Date  ? GERD (gastroesophageal reflux disease)   ? Hypertension   ? ? ?Past Surgical History:  ?Procedure Laterality Date  ? CYSTOSCOPY KIDNEY W/ URETERAL GUIDE WIRE    ? KNEE ARTHROSCOPY W/ MENISCAL REPAIR  2005/2008  ? bilaterally  ? LITHOTRIPSY    ? x2  ? UMBILICAL HERNIA REPAIR N/A 09/06/2013  ? Procedure: HERNIA REPAIR UMBILICAL ADULT;  Surgeon: Wilmon Arms. Corliss Skains, MD;  Location: [facility];  Service: General;  Laterality: N/A;  ? WISDOM TOOTH EXTRACTION    ? ? ?Current Medications: ?Current Outpatient Medications on File Prior to Visit  ?Medication Sig Dispense Refill  ? lisinopril (PRINIVIL,ZESTRIL) 20 MG tablet Take 20 mg by mouth daily.    ? ?No current facility-administered medications on file prior to visit.  ? ? ? ?Allergies:   Patient has no known allergies.  ? ?Social History  ? ?Socioeconomic History  ? Marital status: Married  ?  Spouse name: Not on file  ? Number of children: Not on file  ? Years of education: Not on file  ? Highest education level: Not on file  ?Occupational History   ? Not on file  ?Tobacco Use  ? Smoking status: Never  ? Smokeless tobacco: Not on file  ?Substance and Sexual Activity  ? Alcohol use: No  ? Drug use: No  ? Sexual activity: Not on file  ?Other Topics Concern  ? Not on file  ?Social History Narrative  ? Not on file  ? ?Social Determinants of Health  ? ?Financial Resource Strain: Not on file  ?Food Insecurity: Not on file  ?Transportation Needs: Not on file  ?Physical Activity: Not on file  ?Stress: Not on file  ?Social Connections: Not on file  ?  ? ?Family History: ?The patient's family history includes COPD in his mother; Emphysema in his mother; Heart disease in his father and mother; Thyroid nodules in his mother. ? ?ROS:   ?Please see the history of present illness.    ? All other systems reviewed and are negative. ? ?EKGs/Labs/Other Studies Reviewed:   ? ?The following studies were reviewed today: ? ? ?EKG:  EKG is  ordered today.  The ekg ordered today demonstrates  ? ?NSR, low voltage/q waves in inferior leads ? Lead placement. No prior scar pattern ? ?Recent Labs: ?No results found for requested labs within last 8760 hours.  ?Recent Lipid Panel ?No results found for: CHOL, TRIG, HDL,  CHOLHDL, VLDL, LDLCALC, LDLDIRECT ? ? ?Risk Assessment/Calculations:   ? ? ?    ? ?Physical Exam:   ? ?VS:  ? ?Vitals:  ? 11/02/21 1550  ?BP: 126/80  ?Pulse: 83  ?SpO2: 98%  ? ? ? ?Wt Readings from Last 3 Encounters:  ?11/02/21 230 lb 9.6 oz (104.6 kg)  ?09/06/13 227 lb 4 oz (103.1 kg)  ?08/23/13 224 lb (101.6 kg)  ?  ? ?GEN:  Well nourished, well developed in no acute distress ?HEENT: Normal ?NECK: No JVD; No carotid bruits ?LYMPHATICS: No lymphadenopathy ?CARDIAC: RRR, no murmurs, rubs, gallops ?RESPIRATORY:  Clear to auscultation without rales, wheezing or rhonchi  ?ABDOMEN: Soft, non-tender, non-distended ?MUSCULOSKELETAL:  No edema; No deformity  ?SKIN: Warm and dry ?NEUROLOGIC:  Alert and oriented x 3 ?PSYCHIATRIC:  Normal affect  ? ?ASSESSMENT:   ? ?#Coronary Disease.  He is asymptomatic. Will plan for CVD risk mitigation. LDL goal < 70 mg /dL. Blood pressure goal < 130 /80 mmHg.  We discussed healthy diet with decrease in red meats and fatty foods. Otherwise no plans for ischemic evaluation at this time. Recommend to continue with lifestyle modification yearly A1c, lipid monitoring; otherwise he does not have an indication to continue to follow with cardiology at this time. If he were to develop progressive symptoms of dyspnea/CP with exertion he can follow up ?- increase crestor to 20 mg [ will increase this dose if his LDL is not at goal in 6 weeks] ? ?#HTN: good control. Continue lisinopril 20 mg daily. ? ? ?PLAN:   ? ?In order of problems listed above: ? ? ?Lipid 6 weeks ?Follow up as needed ? ?   ? ?   ? ? ?Medication Adjustments/Labs and Tests Ordered: ?Current medicines are reviewed at length with the patient today.  Concerns regarding medicines are outlined above.  ?No orders of the defined types were placed in this encounter. ? ?Meds ordered this encounter  ?Medications  ? rosuvastatin (CRESTOR) 20 MG tablet  ?  Sig: Take 1 tablet (20 mg total) by mouth daily.  ?  Dispense:  90 tablet  ?  Refill:  3  ? ? ?Patient Instructions  ?Medication Instructions:  ?INCREASE CRESTOR TO 20mg  ONCE DAILY ?*If you need a refill on your cardiac medications before your next appointment, please call your pharmacy* ? ?Follow-Up: ?At [facility], you and your health needs are our priority.  As part of our continuing mission to provide you with exceptional heart care, we have created designated Provider Care Teams.  These Care Teams include your primary Cardiologist (physician) and Advanced Practice Providers (APPs -  Physician Assistants and Nurse Practitioners) who all work together to provide you with the care you need, when you need it. ? ?We recommend signing up for the patient portal called "MyChart".  Sign up information is provided on this After Visit Summary.  MyChart is used to  connect with patients for Virtual Visits (Telemedicine).  Patients are able to view lab/test results, encounter notes, upcoming appointments, etc.  Non-urgent messages can be sent to your provider as well.   ?To learn more about what you can do with MyChart, go to [facility].   ? ?Your next appointment:   ?AS NEEDED  ? ?The format for your next appointment:   ?In Person ? ?Provider:   ?[email or web address], MD   ? ?Heart-Healthy Eating Plan ?Heart-healthy meal planning includes: ?Eating less unhealthy fats. ?Eating more healthy fats. ?Making other changes in your diet. ?Talk  with your doctor or a diet specialist (dietitian) to create an eating plan that is right for you. ?What is my plan? ?Your doctor may recommend an eating plan that includes: ?Total fat: ______% or less of total calories a day. ?Saturated fat: ______% or less of total calories a day. ?Cholesterol: less than _________mg a day. ?What are tips for following this plan? ?Cooking ?Avoid frying your food. Try to bake, boil, grill, or broil it instead. You can also reduce fat by: ?Removing the skin from poultry. ?Removing all visible fats from meats. ?Steaming vegetables in water or broth. ?Meal planning ? ?At meals, divide your plate into four equal parts: ?Fill one-half of your plate with vegetables and green salads. ?Fill one-fourth of your plate with whole grains. ?Fill one-fourth of your plate with lean protein foods. ?Eat 4-5 servings of vegetables per day. A serving of vegetables is: ?1 cup of raw or cooked vegetables. ?2 cups of raw leafy greens. ?Eat 4-5 servings of fruit per day. A serving of fruit is: ?1 medium whole fruit. ?? cup of dried fruit. ?? cup of fresh, frozen, or canned fruit. ?? cup of 100% fruit juice. ?Eat more foods that have soluble fiber. These are apples, broccoli, carrots, beans, peas, and barley. Try to get 20-30 g of fiber per day. ?Eat 4-5 servings of nuts, legumes, and seeds per week: ?1 serving of dried beans or  legumes equals ? cup after being cooked. ?1 serving of nuts is ? cup. ?1 serving of seeds equals 1 tablespoon. ?General information ?Eat more home-cooked food. Eat less restaurant, buffet, and fast food

## 2021-11-03 ENCOUNTER — Other Ambulatory Visit (INDEPENDENT_AMBULATORY_CARE_PROVIDER_SITE_OTHER): Payer: PRIVATE HEALTH INSURANCE

## 2021-11-03 DIAGNOSIS — I2510 Atherosclerotic heart disease of native coronary artery without angina pectoris: Secondary | ICD-10-CM

## 2023-06-19 DIAGNOSIS — E785 Hyperlipidemia, unspecified: Secondary | ICD-10-CM | POA: Diagnosis not present

## 2023-06-19 DIAGNOSIS — I10 Essential (primary) hypertension: Secondary | ICD-10-CM | POA: Diagnosis not present

## 2023-12-20 DIAGNOSIS — I2510 Atherosclerotic heart disease of native coronary artery without angina pectoris: Secondary | ICD-10-CM | POA: Diagnosis not present

## 2023-12-20 DIAGNOSIS — Z0000 Encounter for general adult medical examination without abnormal findings: Secondary | ICD-10-CM | POA: Diagnosis not present

## 2023-12-20 DIAGNOSIS — I10 Essential (primary) hypertension: Secondary | ICD-10-CM | POA: Diagnosis not present

## 2023-12-20 DIAGNOSIS — R944 Abnormal results of kidney function studies: Secondary | ICD-10-CM | POA: Diagnosis not present

## 2023-12-20 DIAGNOSIS — Z125 Encounter for screening for malignant neoplasm of prostate: Secondary | ICD-10-CM | POA: Diagnosis not present

## 2023-12-20 DIAGNOSIS — E7800 Pure hypercholesterolemia, unspecified: Secondary | ICD-10-CM | POA: Diagnosis not present

## 2024-03-13 DIAGNOSIS — M25512 Pain in left shoulder: Secondary | ICD-10-CM | POA: Diagnosis not present

## 2024-03-13 DIAGNOSIS — G8929 Other chronic pain: Secondary | ICD-10-CM | POA: Diagnosis not present

## 2024-03-13 DIAGNOSIS — M25511 Pain in right shoulder: Secondary | ICD-10-CM | POA: Diagnosis not present

## 2024-03-19 DIAGNOSIS — M25511 Pain in right shoulder: Secondary | ICD-10-CM | POA: Diagnosis not present

## 2024-03-19 DIAGNOSIS — M25512 Pain in left shoulder: Secondary | ICD-10-CM | POA: Diagnosis not present

## 2024-03-27 DIAGNOSIS — M25512 Pain in left shoulder: Secondary | ICD-10-CM | POA: Diagnosis not present

## 2024-03-27 DIAGNOSIS — M25511 Pain in right shoulder: Secondary | ICD-10-CM | POA: Diagnosis not present

## 2024-06-19 DIAGNOSIS — I2510 Atherosclerotic heart disease of native coronary artery without angina pectoris: Secondary | ICD-10-CM | POA: Diagnosis not present

## 2024-06-19 DIAGNOSIS — E7800 Pure hypercholesterolemia, unspecified: Secondary | ICD-10-CM | POA: Diagnosis not present

## 2024-06-19 DIAGNOSIS — I10 Essential (primary) hypertension: Secondary | ICD-10-CM | POA: Diagnosis not present
